# Patient Record
Sex: Female | Born: 1970 | Race: Black or African American | Hispanic: No | Marital: Single | Smoking: Never smoker
Health system: Southern US, Community
[De-identification: ages and names within clinical notes are randomized; demographics above are authoritative.]

## PROBLEM LIST (undated history)

## (undated) DIAGNOSIS — M3500 Sicca syndrome, unspecified: Secondary | ICD-10-CM

## (undated) DIAGNOSIS — M329 Systemic lupus erythematosus, unspecified: Secondary | ICD-10-CM

## (undated) HISTORY — PX: TONSILLECTOMY: SUR1361

## (undated) HISTORY — PX: ABDOMINAL HYSTERECTOMY: SHX81

## (undated) HISTORY — PX: ENDOMETRIAL ABLATION: SHX621

---

## 2007-10-15 ENCOUNTER — Emergency Department (HOSPITAL_COMMUNITY): Admission: EM | Admit: 2007-10-15 | Discharge: 2007-10-15 | Payer: Self-pay | Admitting: Emergency Medicine

## 2010-06-06 ENCOUNTER — Emergency Department (HOSPITAL_BASED_OUTPATIENT_CLINIC_OR_DEPARTMENT_OTHER)
Admission: EM | Admit: 2010-06-06 | Discharge: 2010-06-07 | Payer: Self-pay | Source: Home / Self Care | Admitting: Emergency Medicine

## 2010-08-15 LAB — URINE MICROSCOPIC-ADD ON

## 2010-08-15 LAB — BASIC METABOLIC PANEL
BUN: 12 mg/dL (ref 6–23)
CO2: 26 mEq/L (ref 19–32)
Calcium: 8.9 mg/dL (ref 8.4–10.5)
Creatinine, Ser: 0.8 mg/dL (ref 0.4–1.2)
GFR calc Af Amer: >60 mL/min (ref >60)
Glucose, Bld: 113 mg/dL — ABNORMAL HIGH (ref 70–99)

## 2010-08-15 LAB — URINALYSIS, ROUTINE W REFLEX MICROSCOPIC
Glucose, UA: NEGATIVE mg/dL
Ketones, ur: NEGATIVE mg/dL
Leukocytes, UA: NEGATIVE
pH: 6 (ref 5.0–8.0)

## 2011-04-12 ENCOUNTER — Encounter: Payer: Self-pay | Admitting: *Deleted

## 2011-04-12 ENCOUNTER — Emergency Department (HOSPITAL_BASED_OUTPATIENT_CLINIC_OR_DEPARTMENT_OTHER)
Admission: EM | Admit: 2011-04-12 | Discharge: 2011-04-13 | Disposition: A | Discharge status: Home / Self Care | Payer: BC Managed Care – PPO | Attending: Emergency Medicine | Admitting: Emergency Medicine

## 2011-04-12 DIAGNOSIS — R059 Cough, unspecified: Secondary | ICD-10-CM | POA: Insufficient documentation

## 2011-04-12 DIAGNOSIS — IMO0001 Reserved for inherently not codable concepts without codable children: Secondary | ICD-10-CM | POA: Insufficient documentation

## 2011-04-12 DIAGNOSIS — Z79899 Other long term (current) drug therapy: Secondary | ICD-10-CM | POA: Insufficient documentation

## 2011-04-12 DIAGNOSIS — J040 Acute laryngitis: Secondary | ICD-10-CM | POA: Insufficient documentation

## 2011-04-12 DIAGNOSIS — R05 Cough: Secondary | ICD-10-CM | POA: Insufficient documentation

## 2011-04-12 HISTORY — DX: Systemic lupus erythematosus, unspecified: M32.9

## 2011-04-12 NOTE — ED Notes (Signed)
Pt c/o nasal drainage down into throat with a cough for few days, denies fever

## 2011-04-12 NOTE — ED Provider Notes (Signed)
History     CSN: [id number] Arrival date & time: 04/12/2011 11:11 PM   First MD Initiated Contact with Patient 04/12/11 2301      Chief Complaint  Patient presents with  . URI    (Consider location/radiation/quality/duration/timing/severity/associated sxs/prior treatment) The history is provided by the patient.   patient has had nasal congestion with drainage on the throat the last few days. No fever. She's had a nonproductive cough. She feels as if there is congestion in her throat it's come out. She's had a mild sore throat. No difficulty breathing. Said no clear sick contacts. She is on Plaquenil and prednisone for her lupus. No difficulty swallowing, mild pain with swallowing. No difficulty breathing. No difficulty eating. She's had no relief with hot tea use.   Past Medical History  Diagnosis Date  . Lupus     Past Surgical History  Procedure Date  . Tonsillectomy     History reviewed. No pertinent family history.  History  Substance Use Topics  . Smoking status: Never Smoker   . Smokeless tobacco: Not on file  . Alcohol Use: No    OB History    Grav Para Term Preterm Abortions TAB SAB Ect Mult Living                  Review of Systems  Constitutional: Negative for chills, diaphoresis and activity change.  HENT: Positive for congestion, sore throat and voice change. Negative for drooling, trouble swallowing, neck pain, dental problem and ear discharge.   Respiratory: Positive for cough. Negative for choking and chest tightness.   Cardiovascular: Negative for chest pain.  Gastrointestinal: Negative for blood in stool.  Musculoskeletal: Positive for myalgias.    Allergies  Clindamycin/lincomycin cross reactors  Home Medications   Current Outpatient Rx  Name Route Sig Dispense Refill  . B COMPLEX VITAMINS SL Sublingual Place 1 mL under the tongue daily.      Marland Kitchen FEXOFENADINE HCL 180 MG PO TABS Oral Take 180 mg by mouth daily.      Marland Kitchen HYDROXYCHLOROQUINE  SULFATE 200 MG PO TABS Oral Take 300 mg by mouth daily.      Marland Kitchen MYCOPHENOLATE MOFETIL 500 MG PO TABS Oral Take 500-1,000 mg by mouth 2 (two) times daily. Take 2 tabs in the morning and 1 tab at night     . POLYSACCHARIDE IRON 150 MG PO CAPS Oral Take 150 mg by mouth daily.      Marland Kitchen PREDNISONE 5 MG PO TABS Oral Take 20 mg by mouth daily.      . TRAMADOL HCL 50 MG PO TABS Oral Take 50 mg by mouth 3 (three) times daily as needed. For pain. Maximum dose= 8 tablets per day     . GUAIFENESIN 100 MG/5ML PO LIQD Oral Take 5 mLs (100 mg total) by mouth every 4 (four) hours as needed for cough. 60 mL 0    BP 144/76  Pulse 89  Temp(Src) 98.2 F (36.8 C) (Oral)  Resp 18  Ht 5\' 10"  (1.778 m)  Wt 165 lb (74.844 kg)  BMI 23.68 kg/m2  SpO2 100%  LMP 04/03/2011  Physical Exam  Nursing note and vitals reviewed. Constitutional: She is oriented to person, place, and time. She appears well-developed and well-nourished.  HENT:  Head: Normocephalic and atraumatic.  Mouth/Throat: Oropharynx is clear and moist. No oropharyngeal exudate.  Eyes: EOM are normal. Pupils are equal, round, and reactive to light.  Neck: Normal range of motion. Neck supple. No tracheal  deviation present.  Cardiovascular: Normal rate, regular rhythm and normal heart sounds.   Pulmonary/Chest: Effort normal and breath sounds normal. No respiratory distress. She has no wheezes. She has no rales.  Musculoskeletal: Normal range of motion.  Neurological: She is alert and oriented to person, place, and time.  Skin: Skin is warm and dry.  Psychiatric: She has a normal mood and affect. Her speech is normal.    ED Course  Procedures (including critical care time)   Labs Reviewed  RAPID STREP SCREEN   No results found.   1. Laryngitis       MDM  Mild sore throat and harsh voice. Strep negative normal appearing pharynx. Doubt epiglottitis. Appears to be more of a laryngitis, likely viral. She R. he has pain medicines. She be  discharged with guaifenesin.        Juliet Rude. Rubin Payor, MD 04/13/11 [phone]

## 2011-04-13 MED ORDER — GUAIFENESIN 100 MG/5ML PO LIQD: 100.0000 mg | ORAL | Status: AC | PRN

## 2011-04-13 NOTE — ED Notes (Signed)
[name] at bedside to reassess

## 2011-04-13 NOTE — ED Notes (Signed)
rx x 1 given- d/c home with family

## 2011-07-03 ENCOUNTER — Emergency Department (HOSPITAL_BASED_OUTPATIENT_CLINIC_OR_DEPARTMENT_OTHER)
Admission: EM | Admit: 2011-07-03 | Discharge: 2011-07-04 | Disposition: A | Discharge status: Home / Self Care | Payer: BC Managed Care – PPO | Attending: Emergency Medicine | Admitting: Emergency Medicine

## 2011-07-03 ENCOUNTER — Encounter (HOSPITAL_BASED_OUTPATIENT_CLINIC_OR_DEPARTMENT_OTHER): Payer: Self-pay | Admitting: *Deleted

## 2011-07-03 DIAGNOSIS — J3489 Other specified disorders of nose and nasal sinuses: Secondary | ICD-10-CM | POA: Insufficient documentation

## 2011-07-03 DIAGNOSIS — R0981 Nasal congestion: Secondary | ICD-10-CM

## 2011-07-03 NOTE — ED Notes (Signed)
Pt c/o sinus congestion, body aches, and N/V since last week. Pt was seen at her PMD's office today but sts she has only been able to keep 1 Tamiflu down.

## 2011-07-04 NOTE — ED Provider Notes (Signed)
History     CSN: [id number]  Arrival date & time 07/03/11  2143   First MD Initiated Contact with Patient 07/04/11 0102      Chief Complaint  Patient presents with  . Influenza     Patient is a 41 y.o. female presenting with flu symptoms. The history is provided by the patient.  Influenza This is a new problem. The current episode started more than 2 days ago. The problem occurs daily. The problem has not changed since onset.Pertinent negatives include no chest pain and no shortness of breath. The symptoms are aggravated by nothing. The symptoms are relieved by nothing.  pt with recent cough/congestion Now reports pain in right nostril and "feels raw" from nasal drainage No epistaxis is reported No cp/sob reported Seen by PCP, found to be flu+ and started on tamiflu which she started today  Past Medical History  Diagnosis Date  . Lupus     Past Surgical History  Procedure Date  . Tonsillectomy   . Endometrial ablation     No family history on file.  History  Substance Use Topics  . Smoking status: Never Smoker   . Smokeless tobacco: Not on file  . Alcohol Use: No    OB History    Grav Para Term Preterm Abortions TAB SAB Ect Mult Living                  Review of Systems  Respiratory: Negative for shortness of breath.   Cardiovascular: Negative for chest pain.    Allergies  Clindamycin/lincomycin cross reactors  Home Medications   Current Outpatient Rx  Name Route Sig Dispense Refill  . B COMPLEX VITAMINS SL Sublingual Place 1 mL under the tongue daily.      Marland Kitchen FEXOFENADINE HCL 180 MG PO TABS Oral Take 180 mg by mouth daily.      Marland Kitchen HYDROXYCHLOROQUINE SULFATE 200 MG PO TABS Oral Take 300 mg by mouth daily.      Marland Kitchen MYCOPHENOLATE MOFETIL 500 MG PO TABS Oral Take 500-1,000 mg by mouth 2 (two) times daily. Take 2 tabs in the morning and 1 tab at night     . POLYSACCHARIDE IRON 150 MG PO CAPS Oral Take 150 mg by mouth daily.      Marland Kitchen PREDNISONE 5 MG PO TABS Oral  Take 20 mg by mouth daily.      . TRAMADOL HCL 50 MG PO TABS Oral Take 50 mg by mouth 3 (three) times daily as needed. For pain. Maximum dose= 8 tablets per day       BP 130/81  Pulse 100  Temp(Src) 98.5 F (36.9 C) (Oral)  Resp 20  Ht 5\' 10"  (1.778 m)  Wt 160 lb (72.576 kg)  BMI 22.96 kg/m2  SpO2 100%  LMP 06/28/2011  Physical Exam CONSTITUTIONAL: Well developed/well nourished HEAD AND FACE: Normocephalic/atraumatic EYES: EOMI/PERRL ENMT: Mucous membranes moist, nasal congestion, no epistaxis NECK: supple no meningeal signs CV: S1/S2 noted, no murmurs/rubs/gallops noted LUNGS: Lungs are clear to auscultation bilaterally, no apparent distress ABDOMEN: soft, nontender, no rebound or guarding GU:no cva tenderness NEURO: Pt is awake/alert, moves all extremitiesx4 EXTREMITIES: pulses normal, full ROM SKIN: warm, color normal PSYCH: no abnormalities of mood noted   ED Course  Procedures     1. Nasal congestion       MDM  Nursing notes reviewed and considered in documentation         Joya Gaskins, MD 07/04/11 [phone]

## 2014-01-10 ENCOUNTER — Encounter (HOSPITAL_BASED_OUTPATIENT_CLINIC_OR_DEPARTMENT_OTHER): Payer: Self-pay | Admitting: Emergency Medicine

## 2014-01-10 DIAGNOSIS — R197 Diarrhea, unspecified: Secondary | ICD-10-CM | POA: Insufficient documentation

## 2014-01-10 DIAGNOSIS — M329 Systemic lupus erythematosus, unspecified: Secondary | ICD-10-CM | POA: Insufficient documentation

## 2014-01-10 DIAGNOSIS — IMO0002 Reserved for concepts with insufficient information to code with codable children: Secondary | ICD-10-CM | POA: Insufficient documentation

## 2014-01-10 DIAGNOSIS — Z79899 Other long term (current) drug therapy: Secondary | ICD-10-CM | POA: Insufficient documentation

## 2014-01-10 NOTE — ED Notes (Signed)
Pt reports diarrhea for one week, joint aches "for a couple of weeks."

## 2014-01-11 ENCOUNTER — Emergency Department (HOSPITAL_BASED_OUTPATIENT_CLINIC_OR_DEPARTMENT_OTHER)
Admission: EM | Admit: 2014-01-11 | Discharge: 2014-01-11 | Disposition: A | Discharge status: Home / Self Care | Payer: BC Managed Care – PPO | Attending: Emergency Medicine | Admitting: Emergency Medicine

## 2014-01-11 DIAGNOSIS — R197 Diarrhea, unspecified: Secondary | ICD-10-CM

## 2014-01-11 MED ORDER — ONDANSETRON 8 MG PO TBDP: 8.0000 mg | ORAL_TABLET | Freq: Three times a day (TID) | ORAL | Status: AC | PRN

## 2014-01-11 MED ORDER — ONDANSETRON 4 MG PO TBDP
4.0000 mg | ORAL_TABLET | Freq: Once | ORAL | Status: AC
  Administered 2014-01-11: 4 mg via ORAL
  Filled 2014-01-11: qty 1

## 2014-01-11 NOTE — ED Provider Notes (Signed)
CSN: [id number]     Arrival date & time 01/10/14  2239 History   First MD Initiated Contact with Patient 01/11/14 0140     Chief Complaint  Patient presents with  . Diarrhea     (Consider location/radiation/quality/duration/timing/severity/associated sxs/prior Treatment) HPI This is a 43 year old female with history of lupus. She is here with one week history of diarrhea. She describes the diarrhea as watery and brown. She has been taking Pepto-Bismol intermittently without relief; the Pepto-Bismol has turned her stool black. She is otherwise not notice blood or mucus in her stool. She has had associated abdominal cramping which is not severe. She has had nausea but no vomiting. She denies fever. She is not currently being treated for her lupus and is having generalized joint pain; she did see her primary care physician the day before yesterday and has lab work that is pending. She denies being on an antibiotic recently.  Past Medical History  Diagnosis Date  . Lupus    Past Surgical History  Procedure Laterality Date  . Tonsillectomy    . Endometrial ablation     History reviewed. No pertinent family history. History  Substance Use Topics  . Smoking status: Never Smoker   . Smokeless tobacco: Not on file  . Alcohol Use: No   OB History   Grav Para Term Preterm Abortions TAB SAB Ect Mult Living                 Review of Systems  All other systems reviewed and are negative.   Allergies  Clindamycin/lincomycin cross reactors  Home Medications   Prior to Admission medications   Medication Sig Start Date End Date Taking? Authorizing Provider  B COMPLEX VITAMINS SL Place 1 mL under the tongue daily.      Historical Provider, MD  fexofenadine (ALLEGRA) 180 MG tablet Take 180 mg by mouth daily.      Historical Provider, MD  hydroxychloroquine (PLAQUENIL) 200 MG tablet Take 300 mg by mouth daily.      Historical Provider, MD  mycophenolate (CELLCEPT) 500 MG tablet Take  500-1,000 mg by mouth 2 (two) times daily. Take 2 tabs in the morning and 1 tab at night     Historical Provider, MD  polysaccharide iron (NIFEREX) 150 MG CAPS capsule Take 150 mg by mouth daily.      Historical Provider, MD  predniSONE (DELTASONE) 5 MG tablet Take 20 mg by mouth daily.      Historical Provider, MD  traMADol (ULTRAM) 50 MG tablet Take 50 mg by mouth 3 (three) times daily as needed. For pain. Maximum dose= 8 tablets per day     Historical Provider, MD   BP 109/64  Pulse 81  Temp(Src) 98.2 F (36.8 C) (Oral)  Resp 16  Wt 167 lb (75.751 kg)  SpO2 99%  Physical Exam General: Well-developed, well-nourished female in no acute distress; appearance consistent with age of record HENT: normocephalic; atraumatic Eyes: pupils equal, round and reactive to light; extraocular muscles intact Neck: supple Heart: regular rate and rhythm Lungs: clear to auscultation bilaterally Abdomen: soft; nondistended; nontender; no masses or hepatosplenomegaly; bowel sounds mildly hyperactive Extremities: No deformity; full range of motion; pulses normal Neurologic: Awake, alert and oriented; motor function intact in all extremities and symmetric; no facial droop Skin: Warm and dry Psychiatric: Normal mood and affect    ED Course  Procedures (including critical care time)  MDM  3:10 AM Patient unable to provide a stool sample in the ED.  We will have her collect specimens samples at home for culture, O&P and C. difficile. She was advised to take Imodium as needed.    Hanley SeamenJohn L Lucetta Baehr, MD 01/11/14 [phone]

## 2014-01-11 NOTE — ED Notes (Signed)
Pt to bathroom with hat to toilet for stool specimen - pt unable at this time.

## 2014-01-11 NOTE — ED Notes (Signed)
Pt c/o of feeling nauseated at d/c. Zofran ODT given per order. Pt. Has an order for out patient stool samples. Order form and collection bottles given to pt.

## 2014-01-12 LAB — CLOSTRIDIUM DIFFICILE BY PCR: Toxigenic C. Difficile by PCR: NEGATIVE

## 2014-01-13 LAB — OVA AND PARASITE EXAMINATION

## 2014-01-15 LAB — STOOL CULTURE

## 2014-01-16 ENCOUNTER — Telehealth (HOSPITAL_BASED_OUTPATIENT_CLINIC_OR_DEPARTMENT_OTHER): Payer: Self-pay | Admitting: Emergency Medicine

## 2021-09-14 ENCOUNTER — Other Ambulatory Visit: Payer: Self-pay

## 2021-09-14 ENCOUNTER — Emergency Department (HOSPITAL_BASED_OUTPATIENT_CLINIC_OR_DEPARTMENT_OTHER)
Admission: EM | Admit: 2021-09-14 | Discharge: 2021-09-14 | Disposition: A | Discharge status: Home / Self Care | Payer: BC Managed Care – PPO | Attending: Emergency Medicine | Admitting: Emergency Medicine

## 2021-09-14 ENCOUNTER — Encounter (HOSPITAL_BASED_OUTPATIENT_CLINIC_OR_DEPARTMENT_OTHER): Payer: Self-pay

## 2021-09-14 ENCOUNTER — Emergency Department (HOSPITAL_BASED_OUTPATIENT_CLINIC_OR_DEPARTMENT_OTHER): Payer: BC Managed Care – PPO

## 2021-09-14 DIAGNOSIS — R519 Headache, unspecified: Secondary | ICD-10-CM | POA: Insufficient documentation

## 2021-09-14 DIAGNOSIS — R110 Nausea: Secondary | ICD-10-CM | POA: Insufficient documentation

## 2021-09-14 HISTORY — DX: Sjogren syndrome, unspecified: M35.00

## 2021-09-14 LAB — CBC WITH DIFFERENTIAL/PLATELET
Abs Immature Granulocytes: 0.01 10*3/uL (ref 0.00–0.07)
Basophils Absolute: 0 10*3/uL (ref 0.0–0.1)
Basophils Relative: 0 %
Eosinophils Absolute: 0.2 10*3/uL (ref 0.0–0.5)
Eosinophils Relative: 3 %
HCT: 40.1 % (ref 36.0–46.0)
Hemoglobin: 13.7 g/dL (ref 12.0–15.0)
Immature Granulocytes: 0 %
Lymphocytes Relative: 22 %
Lymphs Abs: 1.3 10*3/uL (ref 0.7–4.0)
MCH: 29.5 pg (ref 26.0–34.0)
MCHC: 34.2 g/dL (ref 30.0–36.0)
MCV: 86.2 fL (ref 80.0–100.0)
Monocytes Absolute: 0.6 10*3/uL (ref 0.1–1.0)
Monocytes Relative: 10 %
Neutro Abs: 3.8 10*3/uL (ref 1.7–7.7)
Neutrophils Relative %: 65 %
Platelets: 166 10*3/uL (ref 150–400)
RBC: 4.65 MIL/uL (ref 3.87–5.11)
RDW: 11.8 % (ref 11.5–15.5)
WBC: 5.9 10*3/uL (ref 4.0–10.5)
nRBC: 0 % (ref 0.0–0.2)

## 2021-09-14 LAB — BASIC METABOLIC PANEL
Anion gap: 6 (ref 5–15)
BUN: 15 mg/dL (ref 6–20)
CO2: 26 mmol/L (ref 22–32)
Calcium: 9.2 mg/dL (ref 8.9–10.3)
Chloride: 108 mmol/L (ref 98–111)
Creatinine, Ser: 0.75 mg/dL (ref 0.44–1.00)
GFR, Estimated: >60 mL/min (ref >60)
Glucose, Bld: 90 mg/dL (ref 70–99)
Potassium: 3.7 mmol/L (ref 3.5–5.1)
Sodium: 140 mmol/L (ref 135–145)

## 2021-09-14 MED ORDER — SUMATRIPTAN SUCCINATE 100 MG PO TABS: 100.0000 mg | ORAL_TABLET | ORAL | 0 refills | Status: AC | PRN

## 2021-09-14 MED ORDER — DIPHENHYDRAMINE HCL 50 MG/ML IJ SOLN
25.0000 mg | Freq: Once | INTRAMUSCULAR | Status: DC
  Filled 2021-09-14: qty 1

## 2021-09-14 MED ORDER — SODIUM CHLORIDE 0.9 % IV BOLUS
1000.0000 mL | Freq: Once | INTRAVENOUS | Status: AC
  Administered 2021-09-14: 1000 mL via INTRAVENOUS

## 2021-09-14 MED ORDER — METOCLOPRAMIDE HCL 5 MG/ML IJ SOLN
10.0000 mg | Freq: Once | INTRAMUSCULAR | Status: AC
  Administered 2021-09-14: 10 mg via INTRAVENOUS
  Filled 2021-09-14: qty 2

## 2021-09-14 MED ORDER — DEXAMETHASONE SODIUM PHOSPHATE 4 MG/ML IJ SOLN
4.0000 mg | Freq: Once | INTRAMUSCULAR | Status: AC
  Administered 2021-09-14: 4 mg via INTRAVENOUS
  Filled 2021-09-14: qty 1

## 2021-09-14 NOTE — ED Notes (Signed)
Pt. Speech is clear and has normal path of thoughts with speech.  Pt. Is able to repeat and recite what RN ask for [facility]. ?

## 2021-09-14 NOTE — ED Triage Notes (Signed)
C/o headache x 1 week. Also has neck pain and nausea. ?

## 2021-09-14 NOTE — ED Provider Notes (Signed)
?[facility] EMERGENCY DEPARTMENT ?Provider Note ? ? ?CSN: [id number] ?Arrival date & time: 09/14/21  1948 ? ?  ? ?History ? ?Chief Complaint  ?Patient presents with  ? Headache  ? ? ?Nancy Manning is a 51 y.o. female hx of Sjogren's presenting with headache.  Patient has been having headache for the last week or so.  Patient states that it is progressively getting worse.  She has been taking Tylenol with no relief.  Patient states that she usually does not get headaches.  Also feel nauseated as well. ? ?The history is provided by the patient.  ? ?  ? ?Home Medications ?Prior to Admission medications   ?Medication Sig Start Date End Date Taking? Authorizing Provider  ?B COMPLEX VITAMINS SL Place 1 mL under the tongue daily.      [provider]  ?fexofenadine (ALLEGRA) 180 MG tablet Take 180 mg by mouth daily.      [provider]  ?hydroxychloroquine (PLAQUENIL) 200 MG tablet Take 300 mg by mouth daily.      [provider]  ?mycophenolate (CELLCEPT) 500 MG tablet Take 500-1,000 mg by mouth 2 (two) times daily. Take 2 tabs in the morning and 1 tab at night     [provider]  ?ondansetron (ZOFRAN ODT) 8 MG disintegrating tablet Take 1 tablet (8 mg total) by mouth every 8 (eight) hours as needed for nausea or vomiting. 01/11/14   Molpus, John, MD  ?polysaccharide iron (NIFEREX) 150 MG CAPS capsule Take 150 mg by mouth daily.      [provider]  ?predniSONE (DELTASONE) 5 MG tablet Take 20 mg by mouth daily.      [provider]  ?traMADol (ULTRAM) 50 MG tablet Take 50 mg by mouth 3 (three) times daily as needed. For pain. Maximum dose= 8 tablets per day     [provider]  ?   ? ?Allergies    ?Clindamycin, Clindamycin/lincomycin cross reactors, and Fluconazole   ? ?Review of Systems   ?Review of Systems  ?Neurological:  Positive for headaches.  ?All other systems reviewed and are negative. ? ?Physical Exam ?Updated Vital Signs ?BP 132/78    Pulse 71   Temp 98.2 ?F (36.8 ?C) (Oral)   Resp 18   Ht 5\' 9"  (1.753 m)   Wt 83.9 kg   LMP 06/28/2011   SpO2 100%   BMI 27.32 kg/m?  ?Physical Exam ?Vitals and nursing note reviewed.  ?Constitutional:   ?   Comments: Slightly uncomfortable  ?HENT:  ?   Head: Normocephalic.  ?   Mouth/Throat:  ?   Mouth: Mucous membranes are moist.  ?Eyes:  ?   Extraocular Movements: Extraocular movements intact.  ?   Pupils: Pupils are equal, round, and reactive to light.  ?Cardiovascular:  ?   Rate and Rhythm: Normal rate and regular rhythm.  ?   Heart sounds: Normal heart sounds.  ?Pulmonary:  ?   Effort: Pulmonary effort is normal.  ?   Breath sounds: Normal breath sounds.  ?Abdominal:  ?   General: Bowel sounds are normal.  ?   Palpations: Abdomen is soft.  ?Musculoskeletal:     ?   General: Normal range of motion.  ?Skin: ?   General: Skin is warm.  ?Neurological:  ?   Mental Status: She is alert and oriented to person, place, and time.  ?   Cranial Nerves: No cranial nerve deficit, dysarthria or facial asymmetry.  ?   Sensory:  No sensory deficit.  ?   Motor: No weakness.  ?Psychiatric:     ?   Mood and Affect: Mood normal.     ?   Behavior: Behavior normal.  ? ? ?ED Results / Procedures / Treatments   ?Labs ?(all labs ordered are listed, but only abnormal results are displayed) ?Labs Reviewed  ?CBC WITH DIFFERENTIAL/PLATELET  ?BASIC METABOLIC PANEL  ? ? ?EKG ?EKG Interpretation ? ?Date/Time:  Wednesday September 14 2021 19:59:43 EDT ?Ventricular Rate:  84 ?PR Interval:  180 ?QRS Duration: 82 ?QT Interval:  358 ?QTC Calculation: 423 ?R Axis:   81 ?Text Interpretation: Normal sinus rhythm Normal ECG No previous ECGs available Confirmed by Richardean Canal [phone]) on 09/14/2021 9:16:56 PM ? ?Radiology ?CT Head Wo Contrast ? ?Result Date: 09/14/2021 ?CLINICAL DATA:  Headaches and neck pain, initial encounter EXAM: CT HEAD WITHOUT CONTRAST TECHNIQUE: Contiguous axial images were obtained from the base of the skull through the vertex  without intravenous contrast. RADIATION DOSE REDUCTION: This exam was performed according to the departmental dose-optimization program which includes automated exposure control, adjustment of the mA and/or kV according to patient size and/or use of iterative reconstruction technique. COMPARISON:  None. FINDINGS: Brain: No evidence of acute infarction, hemorrhage, hydrocephalus, extra-axial collection or mass lesion/mass effect. Vascular: No hyperdense vessel or unexpected calcification. Skull: Normal. Negative for fracture or focal lesion. Sinuses/Orbits: No acute finding. Other: None. IMPRESSION: No acute intracranial abnormality noted. Electronically Signed   By: Alcide Clever M.D.   On: 09/14/2021 21:57   ? ?Procedures ?Procedures  ? ? ?Medications Ordered in ED ?Medications  ?sodium chloride 0.9 % bolus 1,000 mL (1,000 mLs Intravenous New Bag/Given 09/14/21 2138)  ?metoCLOPramide (REGLAN) injection 10 mg (10 mg Intravenous Given 09/14/21 2209)  ?dexamethasone (DECADRON) injection 4 mg (4 mg Intravenous Given 09/14/21 2206)  ? ? ?ED Course/ Medical Decision Making/ A&P ?  ?                        ?Medical Decision Making ?Nancy Manning is a 51 y.o. female here presenting with headache.  Patient has new onset headache for the last week.  Given that this is new onset for her, will get CT head to rule out mass.  We will also give migraine cocktail ? ?10:34 PM ?CT head showed no mass.  Labs unremarkable.  Felt better after migraine cocktail.  Stable for discharge.  Will refer to neuro outpatient ? ? ?Problems Addressed: ?Nonintractable headache, unspecified chronicity pattern, unspecified headache type: acute illness or injury ? ?Amount and/or Complexity of Data Reviewed ?Labs: ordered. Decision-making details documented in ED Course. ?Radiology: ordered and independent interpretation performed. Decision-making details documented in ED Course. ?ECG/medicine tests: ordered and independent interpretation performed.  Decision-making details documented in ED Course. ? ?Risk ?Prescription drug management. ? ? ?Final Clinical Impression(s) / ED Diagnoses ?Final diagnoses:  ?None  ? ? ?Rx / DC Orders ?ED Discharge Orders   ? ? None  ? ?  ? ? ?  ?Charlynne Pander, MD ?09/14/21 2235 ? ?

## 2021-09-14 NOTE — Discharge Instructions (Signed)
Your CT scan was unremarkable and your labs were normal today. ? ?Continue Tylenol or Motrin for headache and take Imitrex for severe headaches ? ?You should follow-up with neurology ? ?Return to ER if you have worse headache, vomiting, weakness or numbness  ?

## 2021-09-14 NOTE — ED Notes (Signed)
Pt. Reports headache for 2 days and some nausea today.  Pt. In no distress and is feeling pain 5/10 ?

## 2022-07-22 ENCOUNTER — Emergency Department (HOSPITAL_BASED_OUTPATIENT_CLINIC_OR_DEPARTMENT_OTHER)
Admission: EM | Admit: 2022-07-22 | Discharge: 2022-07-22 | Disposition: A | Discharge status: Home / Self Care | Payer: BC Managed Care – PPO | Attending: Emergency Medicine | Admitting: Emergency Medicine

## 2022-07-22 ENCOUNTER — Encounter (HOSPITAL_BASED_OUTPATIENT_CLINIC_OR_DEPARTMENT_OTHER): Payer: Self-pay

## 2022-07-22 ENCOUNTER — Other Ambulatory Visit: Payer: Self-pay

## 2022-07-22 DIAGNOSIS — N3000 Acute cystitis without hematuria: Secondary | ICD-10-CM | POA: Diagnosis not present

## 2022-07-22 DIAGNOSIS — M545 Low back pain, unspecified: Secondary | ICD-10-CM | POA: Diagnosis present

## 2022-07-22 LAB — URINALYSIS, MICROSCOPIC (REFLEX)

## 2022-07-22 LAB — URINALYSIS, ROUTINE W REFLEX MICROSCOPIC
Bilirubin Urine: NEGATIVE
Glucose, UA: NEGATIVE mg/dL
Hgb urine dipstick: NEGATIVE
Ketones, ur: NEGATIVE mg/dL
Nitrite: NEGATIVE
Protein, ur: NEGATIVE mg/dL
Specific Gravity, Urine: >=1.03 (ref 1.005–1.030)
pH: 6 (ref 5.0–8.0)

## 2022-07-22 MED ORDER — CEPHALEXIN 500 MG PO CAPS: 500.0000 mg | ORAL_CAPSULE | Freq: Three times a day (TID) | ORAL | 0 refills | Status: AC

## 2022-07-22 MED ORDER — CEPHALEXIN 250 MG PO CAPS
500.0000 mg | ORAL_CAPSULE | Freq: Once | ORAL | Status: AC
  Administered 2022-07-22: 500 mg via ORAL
  Filled 2022-07-22: qty 2

## 2022-07-22 NOTE — Discharge Instructions (Signed)
Begin taking Keflex as prescribed.  Take ibuprofen 600 mg every 6 hours as needed for pain.  Return to the ER if symptoms significantly worsen or change.

## 2022-07-22 NOTE — ED Triage Notes (Signed)
Low back pain for about a week and a half. Has been taking Tylenol. C/o nausea. Wants to make sure she doesn't have a kidney infection. Pt denies urinary symptoms, but says she maybe notices some frequency. Denies fever.

## 2022-07-22 NOTE — ED Provider Notes (Signed)
[place] [facility] Provider Note   CSN: [id number] Arrival date & time: 07/22/22  0027     History  Chief Complaint  Patient presents with   Back Pain    Nancy Manning is a 52 y.o. female.  Patient is a 52 year old female with past medical history of Sjogren's syndrome.  Patient presenting today with complaints of low back pain.  This has been occurring intermittently over the past week.  Symptoms began in the absence of any injury or trauma.  Pain is across her lower back with no radiation into her legs.  She denies any bowel or bladder complaints.  She has been taking Tylenol with some relief.  She is concerned she may have a UTI and presents to have this ruled out.  The history is provided by the patient.       Home Medications Prior to Admission medications   Medication Sig Start Date End Date Taking? Authorizing Provider  B COMPLEX VITAMINS SL Place 1 mL under the tongue daily.      [provider]  fexofenadine (ALLEGRA) 180 MG tablet Take 180 mg by mouth daily.      [provider]  hydroxychloroquine (PLAQUENIL) 200 MG tablet Take 300 mg by mouth daily.      [provider]  mycophenolate (CELLCEPT) 500 MG tablet Take 500-1,000 mg by mouth 2 (two) times daily. Take 2 tabs in the morning and 1 tab at night     [provider]  ondansetron (ZOFRAN ODT) 8 MG disintegrating tablet Take 1 tablet (8 mg total) by mouth every 8 (eight) hours as needed for nausea or vomiting. 01/11/14   Molpus, John, MD  polysaccharide iron (NIFEREX) 150 MG CAPS capsule Take 150 mg by mouth daily.      [provider]  predniSONE (DELTASONE) 5 MG tablet Take 20 mg by mouth daily.      [provider]  SUMAtriptan (IMITREX) 100 MG tablet Take 1 tablet (100 mg total) by mouth every 2 (two) hours as needed for migraine. May repeat in 2 hours if headache persists or recurs. 09/14/21   Drenda Freeze, MD   traMADol (ULTRAM) 50 MG tablet Take 50 mg by mouth 3 (three) times daily as needed. For pain. Maximum dose= 8 tablets per day     [provider]      Allergies    Clindamycin, Clindamycin/lincomycin cross reactors, and Fluconazole    Review of Systems   Review of Systems  All other systems reviewed and are negative.   Physical Exam Updated Vital Signs BP 121/79 (BP Location: Right Arm)   Pulse 78   LMP 06/28/2011   SpO2 100%  Physical Exam Vitals and nursing note reviewed.  Constitutional:      General: She is not in acute distress.    Appearance: She is well-developed. She is not diaphoretic.  HENT:     Head: Normocephalic and atraumatic.  Cardiovascular:     Heart sounds: No murmur heard.    No friction rub. No gallop.  Abdominal:     General: Bowel sounds are normal. There is no distension.     Palpations: Abdomen is soft.     Tenderness: There is no abdominal tenderness.  Musculoskeletal:        General: Normal range of motion.     Cervical back: Normal range of motion and neck supple.  Skin:    General: Skin is warm and dry.  Neurological:     General: No focal deficit present.     Mental Status: She is alert and oriented to person, place, and time.     ED Results / Procedures / Treatments   Labs (all labs ordered are listed, but only abnormal results are displayed) Labs Reviewed  URINALYSIS, ROUTINE W REFLEX MICROSCOPIC    EKG None  Radiology No results found.  Procedures Procedures    Medications Ordered in ED Medications - No data to display  ED Course/ Medical Decision Making/ A&P  Urinalysis suggestive of a UTI.  Patient to be treated with Keflex and return as needed.  Final Clinical Impression(s) / ED Diagnoses Final diagnoses:  None    Rx / DC Orders ED Discharge Orders     None         Veryl Speak, MD 07/22/22 [phone]

## 2023-09-21 ENCOUNTER — Emergency Department (HOSPITAL_BASED_OUTPATIENT_CLINIC_OR_DEPARTMENT_OTHER)
Admission: EM | Admit: 2023-09-21 | Discharge: 2023-09-22 | Disposition: A | Discharge status: Home / Self Care | Attending: Emergency Medicine | Admitting: Emergency Medicine

## 2023-09-21 ENCOUNTER — Emergency Department (HOSPITAL_BASED_OUTPATIENT_CLINIC_OR_DEPARTMENT_OTHER)

## 2023-09-21 ENCOUNTER — Encounter (HOSPITAL_BASED_OUTPATIENT_CLINIC_OR_DEPARTMENT_OTHER): Payer: Self-pay | Admitting: Emergency Medicine

## 2023-09-21 ENCOUNTER — Other Ambulatory Visit: Payer: Self-pay

## 2023-09-21 DIAGNOSIS — R1084 Generalized abdominal pain: Secondary | ICD-10-CM | POA: Diagnosis not present

## 2023-09-21 DIAGNOSIS — K5900 Constipation, unspecified: Secondary | ICD-10-CM | POA: Insufficient documentation

## 2023-09-21 LAB — COMPREHENSIVE METABOLIC PANEL WITH GFR
ALT: 16 U/L (ref 0–44)
AST: 17 U/L (ref 15–41)
Albumin: 4.1 g/dL (ref 3.5–5.0)
Alkaline Phosphatase: 80 U/L (ref 38–126)
Anion gap: 7 (ref 5–15)
BUN: 14 mg/dL (ref 6–20)
CO2: 28 mmol/L (ref 22–32)
Calcium: 9.1 mg/dL (ref 8.9–10.3)
Chloride: 106 mmol/L (ref 98–111)
Creatinine, Ser: 0.95 mg/dL (ref 0.44–1.00)
GFR, Estimated: >60 mL/min (ref >60)
Glucose, Bld: 103 mg/dL — ABNORMAL HIGH (ref 70–99)
Potassium: 3.9 mmol/L (ref 3.5–5.1)
Sodium: 141 mmol/L (ref 135–145)
Total Bilirubin: 0.9 mg/dL (ref 0.0–1.2)
Total Protein: 7.4 g/dL (ref 6.5–8.1)

## 2023-09-21 LAB — CBC
HCT: 39.6 % (ref 36.0–46.0)
Hemoglobin: 13.5 g/dL (ref 12.0–15.0)
MCH: 29.5 pg (ref 26.0–34.0)
MCHC: 34.1 g/dL (ref 30.0–36.0)
MCV: 86.7 fL (ref 80.0–100.0)
Platelets: 181 10*3/uL (ref 150–400)
RBC: 4.57 MIL/uL (ref 3.87–5.11)
RDW: 12.1 % (ref 11.5–15.5)
WBC: 5.6 10*3/uL (ref 4.0–10.5)
nRBC: 0 % (ref 0.0–0.2)

## 2023-09-21 LAB — LIPASE, BLOOD: Lipase: 29 U/L (ref 11–51)

## 2023-09-21 MED ORDER — IOHEXOL 300 MG/ML  SOLN
75.0000 mL | Freq: Once | INTRAMUSCULAR | Status: AC | PRN
  Administered 2023-09-21: 75 mL via INTRAVENOUS

## 2023-09-21 NOTE — ED Triage Notes (Signed)
 Intermittent left sided abdominal pain ongoing since Monday associated with decreased appetite and constipation. Using miralax with no relief. Denies n/v and fevers.

## 2023-09-21 NOTE — Discharge Instructions (Addendum)
 As discussed, your visit to the emergency department today was overall reassuring.  Your blood test appeared normal.  CT imaging did show evidence of constipation but without other evidence of acute abnormality.  Will try magnesium  citrate to help with bowel movements.  Recommend beginning a daily fiber supplement.  Also recommend continued oral hydration.  Your CT scan also showed a cyst on your right kidney; recommend getting a ultrasound in the outpatient setting by your primary care for further assessment regarding this.  Recommend follow-up with your primary care for reassessment of your symptoms.  Please do not hesitate to return to the emergency department if the worrisome signs and symptoms were discussed to become apparent.

## 2023-09-21 NOTE — ED Provider Notes (Signed)
 [place] [facility] Provider Note   CSN: [id number] Arrival date & time: 09/21/23  2052     History  Chief Complaint  Patient presents with   Abdominal Pain   Constipation    Nancy Manning is a 53 y.o. female.   Abdominal Pain Associated symptoms: constipation   Constipation Associated symptoms: abdominal pain     53 year old female presents emergency department with complaints of abdominal pain, constipation.  Patient states that she been having symptoms since Monday.  Has been passing gas and very small amounts of stool.  States that she usually has a large bowel movement at least once daily.  Has tried MiraLAX as well as enema without improvement of symptoms prompting visit to emergency department.  Denies any fevers, nausea, vomiting, urinary symptoms.  States her last colonoscopy was around 2 years ago and only showed a polyp and internal hemorrhoids.  Patient states that she feels like her abdomen is distended.  Past medical history significant for lupus, Sjogren's disease  Home Medications Prior to Admission medications   Medication Sig Start Date End Date Taking? Authorizing Provider  magnesium  citrate SOLN Take 296 mLs (1 Bottle total) by mouth once for 1 dose. 09/22/23 09/22/23 Yes Silver Fell A, PA  B COMPLEX VITAMINS SL Place 1 mL under the tongue daily.      [provider]  cephALEXin  (KEFLEX ) 500 MG capsule Take 1 capsule (500 mg total) by mouth 3 (three) times daily. 07/22/22   Geroldine Berg, MD  fexofenadine (ALLEGRA) 180 MG tablet Take 180 mg by mouth daily.      [provider]  hydroxychloroquine (PLAQUENIL) 200 MG tablet Take 300 mg by mouth daily.      [provider]  mycophenolate (CELLCEPT) 500 MG tablet Take 500-1,000 mg by mouth 2 (two) times daily. Take 2 tabs in the morning and 1 tab at night     [provider]  ondansetron  (ZOFRAN  ODT) 8 MG disintegrating tablet Take 1  tablet (8 mg total) by mouth every 8 (eight) hours as needed for nausea or vomiting. 01/11/14   Molpus, John, MD  polysaccharide iron (NIFEREX) 150 MG CAPS capsule Take 150 mg by mouth daily.      [provider]  predniSONE (DELTASONE) 5 MG tablet Take 20 mg by mouth daily.      [provider]  SUMAtriptan  (IMITREX ) 100 MG tablet Take 1 tablet (100 mg total) by mouth every 2 (two) hours as needed for migraine. May repeat in 2 hours if headache persists or recurs. 09/14/21   Patt Alm Macho, MD  traMADol (ULTRAM) 50 MG tablet Take 50 mg by mouth 3 (three) times daily as needed. For pain. Maximum dose= 8 tablets per day     [provider]      Allergies    Fluconazole, Nitrofurantoin, Clindamycin, Clindamycin/lincomycin cross reactors, and Other    Review of Systems   Review of Systems  Gastrointestinal:  Positive for abdominal pain and constipation.  All other systems reviewed and are negative.   Physical Exam Updated Vital Signs BP 136/85 (BP Location: Left Arm)   Pulse 85   Temp 98.5 F (36.9 C)   Resp 17   Ht 5' 11 (1.803 m)   Wt 83.9 kg   LMP 06/28/2011   SpO2 100%   BMI 25.80 kg/m  Physical Exam Vitals and nursing note reviewed.  Constitutional:      General: She is not in acute  distress.    Appearance: She is well-developed.  HENT:     Head: Normocephalic and atraumatic.  Eyes:     Conjunctiva/sclera: Conjunctivae normal.  Cardiovascular:     Rate and Rhythm: Normal rate and regular rhythm.     Heart sounds: No murmur heard. Pulmonary:     Effort: Pulmonary effort is normal. No respiratory distress.     Breath sounds: Normal breath sounds.  Abdominal:     Palpations: Abdomen is soft.     Tenderness: There is generalized abdominal tenderness. There is no guarding or rebound.  Musculoskeletal:        General: No swelling.     Cervical back: Neck supple.  Skin:    General: Skin is warm and dry.     Capillary Refill: Capillary refill  takes less than 2 seconds.  Neurological:     Mental Status: She is alert.  Psychiatric:        Mood and Affect: Mood normal.     ED Results / Procedures / Treatments   Labs (all labs ordered are listed, but only abnormal results are displayed) Labs Reviewed  COMPREHENSIVE METABOLIC PANEL WITH GFR - Abnormal; Notable for the following components:      Result Value   Glucose, Bld 103 (*)    All other components within normal limits  CBC  LIPASE, BLOOD  URINALYSIS, ROUTINE W REFLEX MICROSCOPIC    EKG None  Radiology CT ABDOMEN PELVIS W CONTRAST Result Date: 09/22/2023 CLINICAL DATA:  Abdominal pain, acute, nonlocalized. EXAM: CT ABDOMEN AND PELVIS WITH CONTRAST TECHNIQUE: Multidetector CT imaging of the abdomen and pelvis was performed using the standard protocol following bolus administration of intravenous contrast. RADIATION DOSE REDUCTION: This exam was performed according to the departmental dose-optimization program which includes automated exposure control, adjustment of the mA and/or kV according to patient size and/or use of iterative reconstruction technique. CONTRAST:  75mL OMNIPAQUE  IOHEXOL  300 MG/ML  SOLN COMPARISON:  None Available. FINDINGS: Lower chest: There is scattered linear scarring or atelectasis in the lung bases without acute process. The cardiac size is normal. Hepatobiliary: There is a 6 mm cyst, Hounsfield density is 4.9, in the dome of hepatic segment 7. No follow-up imaging is recommended. The liver is otherwise unremarkable. The gallbladder and bile ducts are unremarkable. Pancreas: No abnormality. Spleen: No abnormality.  No splenomegaly. Adrenals/Urinary Tract: There is no adrenal mass. There is no renal mass enhancement. There is a parapelvic low-density lesion in the superior pole of the right kidney medially, measuring 1.8 cm and 34 Hounsfield units, indeterminate by density. Follow-up ultrasound is recommended to assess for a complex cyst or a proteinaceous  or hemorrhagic cyst. Both kidneys are otherwise unremarkable. There is no urinary stone or obstruction. There is symmetric excretion in the delayed phase. Bladder is unremarkable for the degree of distension. Stomach/Bowel: No dilatation or wall thickening. Normal appendix extends along the right pelvic side wall. There is moderate fecal stasis. Scattered diverticulosis without evidence of colitis or diverticulitis. Vascular/Lymphatic: No significant vascular findings are present. No enlarged abdominal or pelvic lymph nodes. Reproductive: Status post hysterectomy. No adnexal masses. Other: No abdominal wall hernia or abnormality. No abdominopelvic ascites. Musculoskeletal: No acute or significant osseous findings. IMPRESSION: 1. No acute CT findings in the abdomen or pelvis. 2. Constipation and diverticulosis. 3. 1.8 cm indeterminate parapelvic low-density lesion in the superior pole of the right kidney medially. Follow-up ultrasound is recommended to assess for a complex cyst or a proteinaceous or hemorrhagic cyst. Electronically  Signed   By: Francis Quam M.D.   On: 09/22/2023 00:02    Procedures Procedures    Medications Ordered in ED Medications  iohexol  (OMNIPAQUE ) 300 MG/ML solution 75 mL (75 mLs Intravenous Contrast Given 09/21/23 2253)    ED Course/ Medical Decision Making/ A&P                                 Medical Decision Making Amount and/or Complexity of Data Reviewed Labs: ordered. Radiology: ordered.  Risk Prescription drug management.   This patient presents to the ED for concern of abdominal pain, constipation, this involves an extensive number of treatment options, and is a complaint that carries with it a high risk of complications and morbidity.  The differential diagnosis includes malignancy, SBO/LBO, volvulus, constipation, other   Co morbidities that complicate the patient evaluation  See HPI   Additional history obtained:  Additional history obtained from  EM[facility] External records from outside source obtained and reviewed including hospital records   Lab Tests:  I Ordered, and personally interpreted labs.  The pertinent results include: No leukocytosis.  No evidence of anemia.  Platelets within range.  No Electra abnormalities.  No transaminitis.  No renal dysfunction.  Lipase within normal limits.  UA pending   Imaging Studies ordered:  I ordered imaging studies including CT abdomen pelvis which showed constipation.  Diverticulosis.  Right-sided renal cyst.  No other acute intra-abdominal/pelvic abnormalities.  Cardiac Monitoring: / EKG:  The patient was maintained on a cardiac monitor.  I personally viewed and interpreted the cardiac monitored which showed an underlying rhythm of: sinus rhythm   Consultations Obtained:  N/a   Problem List / ED Course / Critical interventions / Medication management  Constipation, generalized abdominal pain Reevaluation of the patient showed that the patient stayed the same I have reviewed the patients home medicines and have made adjustments as needed   Social Determinants of Health:  Denies tobacco, licit drug use.   Test / Admission - Considered:  Constipation, generalized abdominal pain Vitals signs within normal range and stable throughout visit. Laboratory/imaging studies significant for: see above 53 year old female presents emergency department with complaints of abdominal pain, constipation.  Patient states that she been having symptoms since Monday.  Has been passing gas and very small amounts of stool.  States that she usually has a large bowel movement at least once daily.  Has tried MiraLAX as well as enema without improvement of symptoms prompting visit to emergency department.  Denies any fevers, nausea, vomiting, urinary symptoms.  States her last colonoscopy was around 2 years ago and only showed a polyp and internal hemorrhoids.  Patient states that she feels like her abdomen is  distended. On exam, generalized abdominal tenderness appreciated.  No peritoneal signs.  Labs unremarkable for any acute emergent process.  CT imaging concerning for constipation with a right-sided renal cyst incidentally noted.  Will trial magnesium  citrate for treatment of patient's acute constipation and recommend daily fiber supplementation, adequate oral hydration to decrease likelihood of recurrence.  Will recommend follow-up with PCP in the outpatient setting for reassessment.  Treatment plan discussed with patient and she acknowledged understanding was agreeable to said plan.  Patient overall well-appearing, afebrile in no acute distress. Worrisome signs and symptoms were discussed with the patient, and the patient acknowledged understanding to return to the ED if noticed. Patient was stable upon discharge.  Final Clinical Impression(s) / ED Diagnoses Final diagnoses:  Constipation, unspecified constipation type  Generalized abdominal pain    Rx / DC Orders ED Discharge Orders          Ordered    magnesium  citrate SOLN   Once        09/22/23 0003              Silver Wonda LABOR, PA 09/22/23 0011    Zackowski, Scott, MD 09/28/23 [phone]

## 2023-09-22 MED ORDER — MAGNESIUM CITRATE PO SOLN
1.0000 | Freq: Once | ORAL | 0 refills | Status: AC
Start: 2023-09-22 — End: 2023-09-22

## 2023-09-22 NOTE — ED Provider Notes (Incomplete)
 [place] [facility] Provider Note   CSN: [id number] Arrival date & time: 09/21/23  2052     History {Add pertinent medical, surgical, social history, OB history to HPI:1} Chief Complaint  Patient presents with  . Abdominal Pain  . Constipation    Nancy Manning is a 53 y.o. female.   Abdominal Pain Associated symptoms: constipation   Constipation Associated symptoms: abdominal pain     53 year old female presents emergency department with complaints of abdominal pain, constipation.  Patient states that she been having symptoms since Monday.  Has been passing gas and very small amounts of stool.  States that she usually has a large bowel movement at least once daily.  Has tried MiraLAX as well as enema without improvement of symptoms prompting visit to emergency department.  Denies any fevers, nausea, vomiting, urinary symptoms.  States her last colonoscopy was around 2 years ago and only showed a polyp and internal hemorrhoids.  Patient states that she feels like her abdomen is distended.  Past medical history significant for lupus, Sjogren's disease  Home Medications Prior to Admission medications   Medication Sig Start Date End Date Taking? Authorizing Provider  B COMPLEX VITAMINS SL Place 1 mL under the tongue daily.      [provider]  cephALEXin  (KEFLEX ) 500 MG capsule Take 1 capsule (500 mg total) by mouth 3 (three) times daily. 07/22/22   Orvilla Blander, MD  fexofenadine (ALLEGRA) 180 MG tablet Take 180 mg by mouth daily.      [provider]  hydroxychloroquine (PLAQUENIL) 200 MG tablet Take 300 mg by mouth daily.      [provider]  mycophenolate (CELLCEPT) 500 MG tablet Take 500-1,000 mg by mouth 2 (two) times daily. Take 2 tabs in the morning and 1 tab at night     [provider]  ondansetron  (ZOFRAN  ODT) 8 MG disintegrating tablet Take 1 tablet (8 mg total) by mouth every 8 (eight) hours  as needed for nausea or vomiting. 01/11/14   Molpus, John, MD  polysaccharide iron (NIFEREX) 150 MG CAPS capsule Take 150 mg by mouth daily.      [provider]  predniSONE (DELTASONE) 5 MG tablet Take 20 mg by mouth daily.      [provider]  SUMAtriptan  (IMITREX ) 100 MG tablet Take 1 tablet (100 mg total) by mouth every 2 (two) hours as needed for migraine. May repeat in 2 hours if headache persists or recurs. 09/14/21   Dalene Duck, MD  traMADol (ULTRAM) 50 MG tablet Take 50 mg by mouth 3 (three) times daily as needed. For pain. Maximum dose= 8 tablets per day     [provider]      Allergies    Fluconazole, Nitrofurantoin, Clindamycin, Clindamycin/lincomycin cross reactors, and Other    Review of Systems   Review of Systems  Gastrointestinal:  Positive for abdominal pain and constipation.  All other systems reviewed and are negative.   Physical Exam Updated Vital Signs BP 136/85 (BP Location: Left Arm)   Pulse 85   Temp 98.5 F (36.9 C)   Resp 17   Ht 5\' 11"  (1.803 m)   Wt 83.9 kg   LMP 06/28/2011   SpO2 100%   BMI 25.80 kg/m  Physical Exam Vitals and nursing note reviewed.  Constitutional:      General: She is not in acute distress.    Appearance: She is well-developed.  HENT:  Head: Normocephalic and atraumatic.  Eyes:     Conjunctiva/sclera: Conjunctivae normal.  Cardiovascular:     Rate and Rhythm: Normal rate and regular rhythm.     Heart sounds: No murmur heard. Pulmonary:     Effort: Pulmonary effort is normal. No respiratory distress.     Breath sounds: Normal breath sounds.  Abdominal:     Palpations: Abdomen is soft.     Tenderness: There is generalized abdominal tenderness. There is no guarding or rebound.  Musculoskeletal:        General: No swelling.     Cervical back: Neck supple.  Skin:    General: Skin is warm and dry.     Capillary Refill: Capillary refill takes less than 2 seconds.  Neurological:      Mental Status: She is alert.  Psychiatric:        Mood and Affect: Mood normal.     ED Results / Procedures / Treatments   Labs (all labs ordered are listed, but only abnormal results are displayed) Labs Reviewed  COMPREHENSIVE METABOLIC PANEL WITH GFR  CBC  LIPASE, BLOOD  URINALYSIS, ROUTINE W REFLEX MICROSCOPIC    EKG None  Radiology No results found.  Procedures Procedures  {Document cardiac monitor, telemetry assessment procedure when appropriate:1}  Medications Ordered in ED Medications - No data to display  ED Course/ Medical Decision Making/ A&P   {   Click here for ABCD2, HEART and other calculatorsREFRESH Note before signing :1}                              Medical Decision Making Amount and/or Complexity of Data Reviewed Labs: ordered. Radiology: ordered.  Risk Prescription drug management.   This patient presents to the ED for concern of abdominal pain, constipation, this involves an extensive number of treatment options, and is a complaint that carries with it a high risk of complications and morbidity.  The differential diagnosis includes malignancy, SBO/LBO, volvulus, constipation, other   Co morbidities that complicate the patient evaluation  See HPI   Additional history obtained:  Additional history obtained from EMR External records from outside source obtained and reviewed including hospital records   Lab Tests:  I Ordered, and personally interpreted labs.  The pertinent results include: No leukocytosis.  No evidence of anemia.  Platelets within range.  No Electra abnormalities.  No transaminitis.  No renal dysfunction.  Lipase within normal limits.  UA pending   Imaging Studies ordered:  I ordered imaging studies including CT abdomen pelvis which is pending at shift change  Cardiac Monitoring: / EKG:  The patient was maintained on a cardiac monitor.  I personally viewed and interpreted the cardiac monitored which showed an  underlying rhythm of: sinus rhythm   Consultations Obtained:  N/a   Problem List / ED Course / Critical interventions / Medication management  *** Reevaluation of the patient showed that the patient stayed the same I have reviewed the patients home medicines and have made adjustments as needed   Social Determinants of Health:  Denies tobacco, licit drug use.   Test / Admission - Considered:  *** Vitals signs within normal range and stable throughout visit. Laboratory/imaging studies significant for: see above 53 year old female presents emergency department with complaints of abdominal pain, constipation.  Patient states that she been having symptoms since Monday.  Has been passing gas and very small amounts of stool.  States that she usually has a  large bowel movement at least once daily.  Has tried MiraLAX as well as enema without improvement of symptoms prompting visit to emergency department.  Denies any fevers, nausea, vomiting, urinary symptoms.  States her last colonoscopy was around 2 years ago and only showed a polyp and internal hemorrhoids.  Patient states that she feels like her abdomen is distended. *** Worrisome signs and symptoms were discussed with the patient, and the patient acknowledged understanding to return to the ED if noticed. Patient was stable upon discharge.    {Document critical care time when appropriate:1} {Document review of labs and clinical decision tools ie heart score, Chads2Vasc2 etc:1}  {Document your independent review of radiology images, and any outside records:1} {Document your discussion with family members, caretakers, and with consultants:1} {Document social determinants of health affecting pt's care:1} {Document your decision making why or why not admission, treatments were needed:1} Final Clinical Impression(s) / ED Diagnoses Final diagnoses:  None    Rx / DC Orders ED Discharge Orders     None

## 2023-12-16 IMAGING — CT CT HEAD W/O CM
3 series · 15 of 47 positions shown, 18 images · non-contrast
Comparison: None.

CLINICAL DATA: Headaches and neck pain, initial encounter



[Series 2: head wo · axial · 0.44mm/px · z∈[+15,+150]mm · 9 of 33 slices shown, 12 images]
[im 3/33  brain]
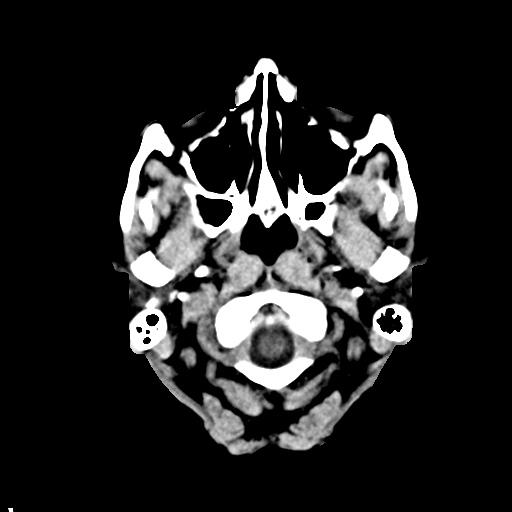
[im 3/33  bone]
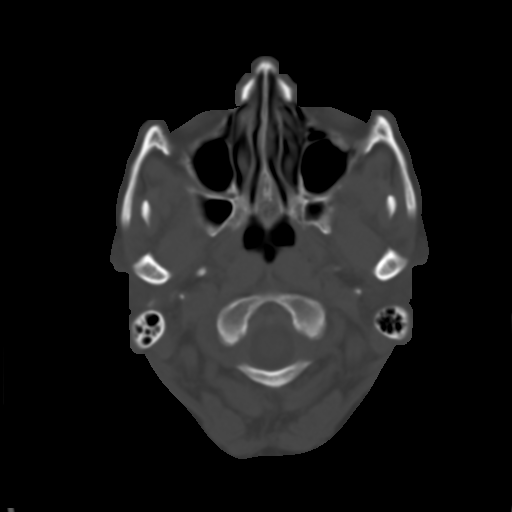
[im 6/33  brain]
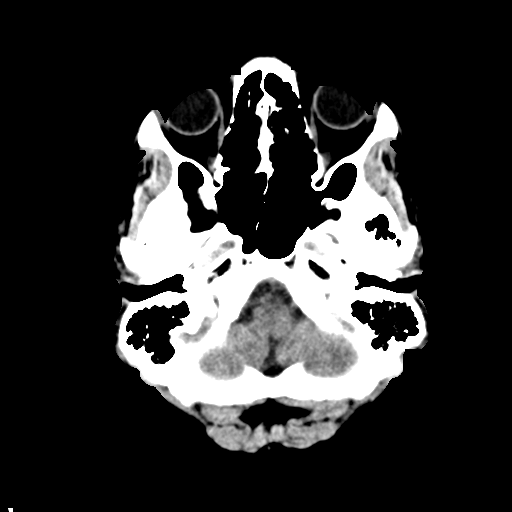
[im 9/33  brain]
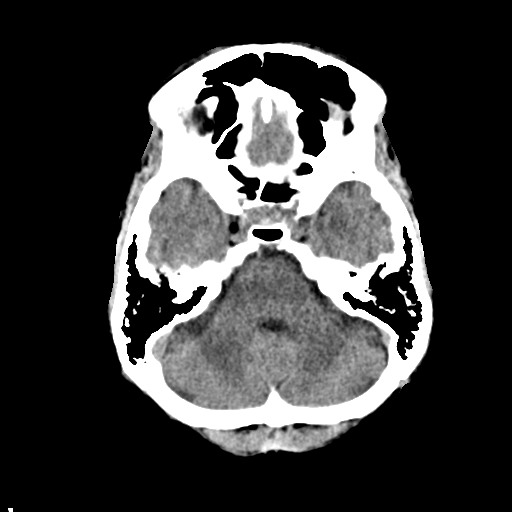
[im 13/33  brain]
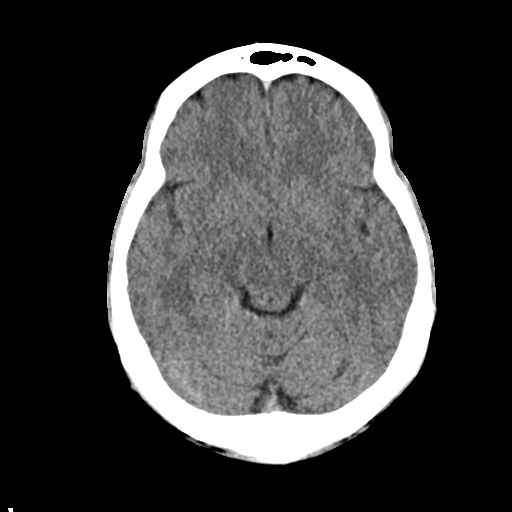
[im 17/33  brain]
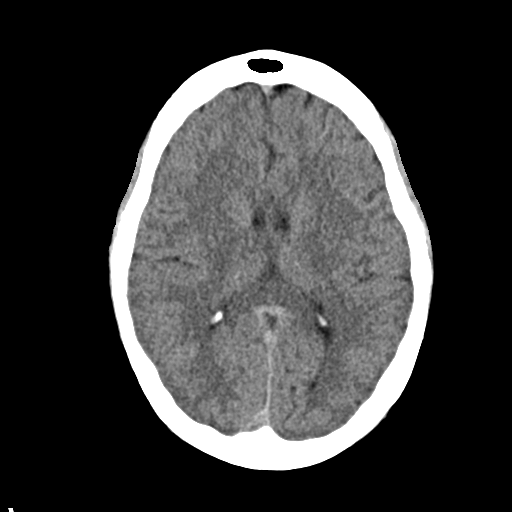
[im 17/33  bone]
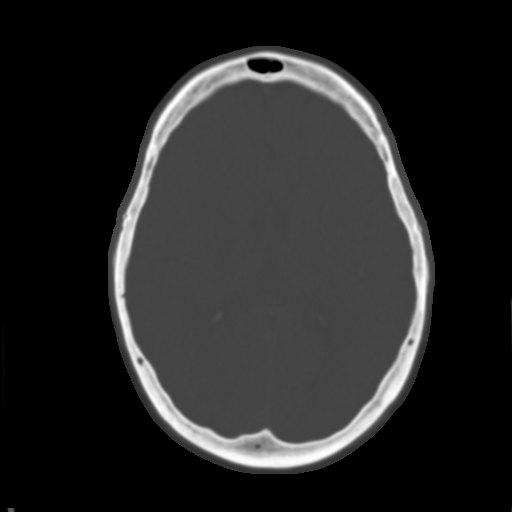
[im 20/33  brain]
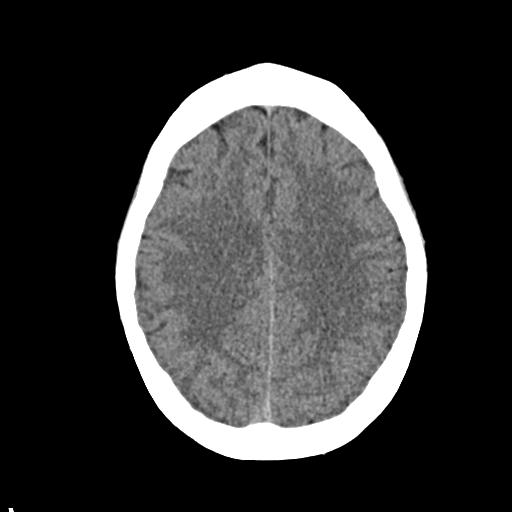
[im 24/33  brain]
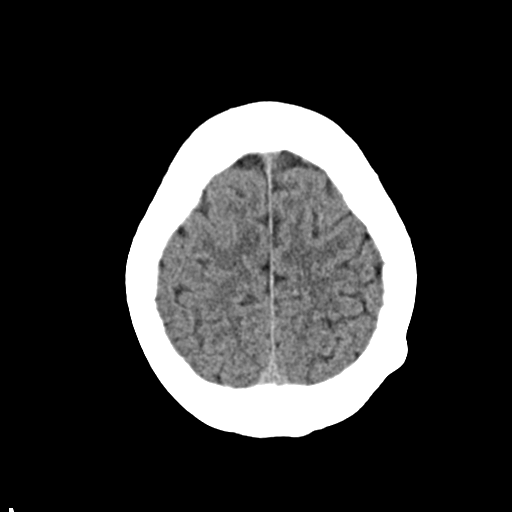
[im 27/33  brain]
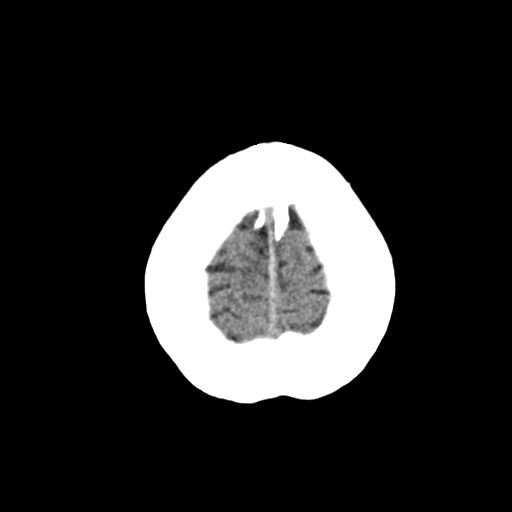
[im 30/33  brain]
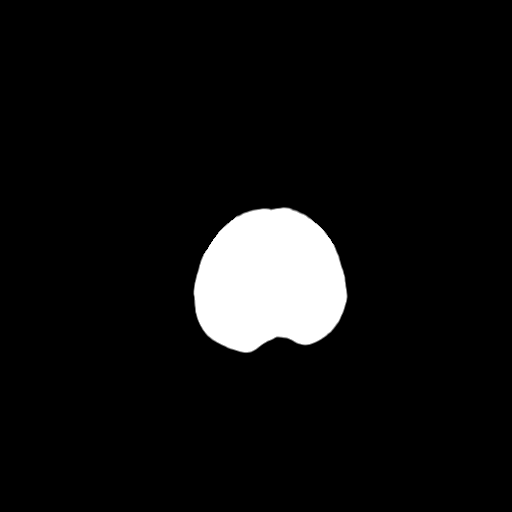
[im 30/33  bone]
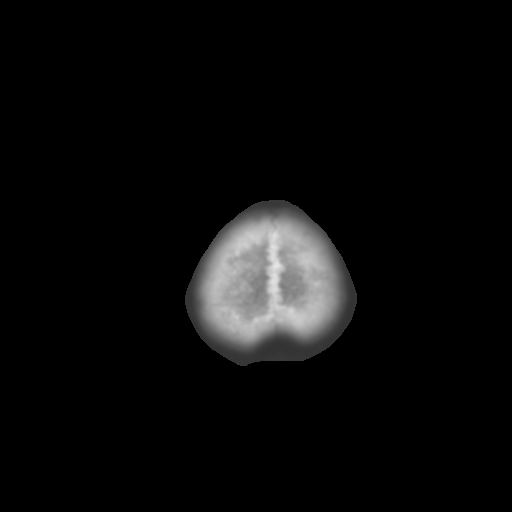

[Series 4: coronal soft · coronal · 0.33mm/px · 3 of 70 slices shown]
[im 24/70  brain]
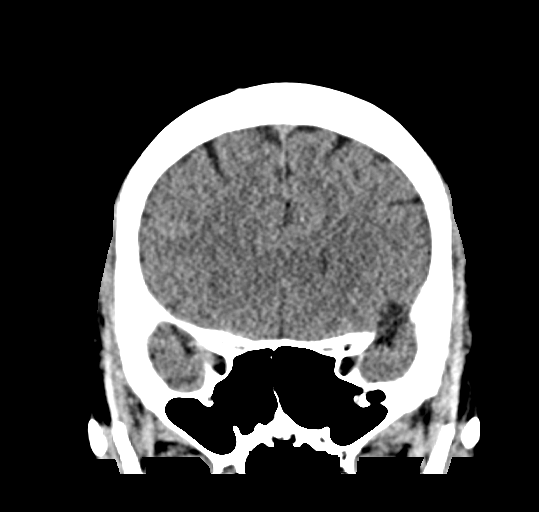
[im 31/70  brain]
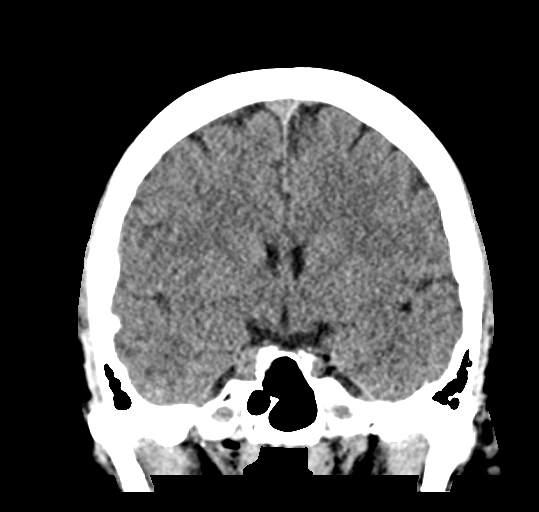
[im 39/70  brain]
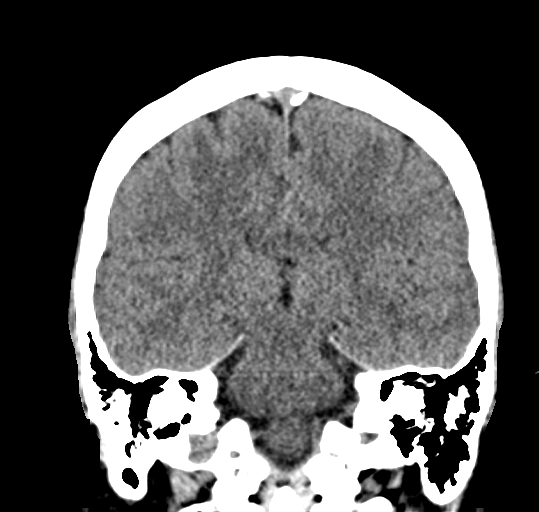

[Series 5: sag soft · sagittal · 0.32mm/px · 3 of 61 slices shown]
[im 21/61  brain]
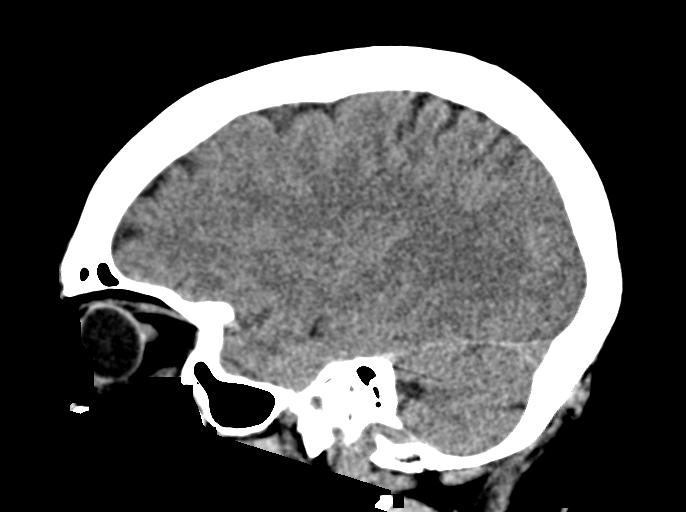
[im 31/61  brain]
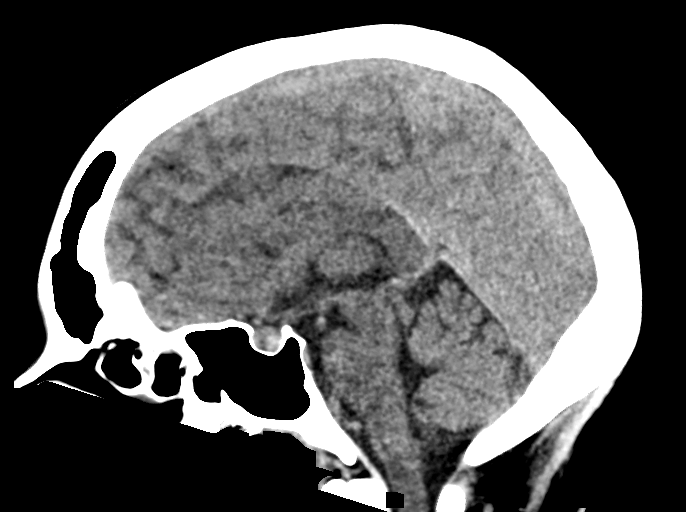
[im 41/61  brain]
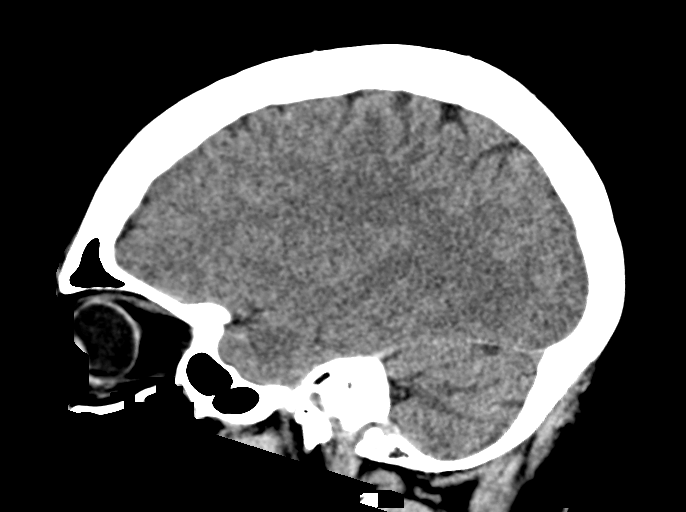

[15 of 47 positions shown; findings below may reference images not displayed]

FINDINGS: Brain: No evidence of acute infarction, hemorrhage, hydrocephalus,
extra-axial collection or mass lesion/mass effect.

Vascular: No hyperdense vessel or unexpected calcification.

Skull: Normal. Negative for fracture or focal lesion.

Sinuses/Orbits: No acute finding.

Other: None.
IMPRESSION: No acute intracranial abnormality noted.
